# Patient Record
Sex: Female | Born: 1985 | Race: Black or African American | Hispanic: No | Marital: Married | Smoking: Never smoker
Health system: Southern US, Community
[De-identification: ages and names within clinical notes are randomized; demographics above are authoritative.]

---

## 2021-09-08 ENCOUNTER — Other Ambulatory Visit: Payer: Self-pay | Admitting: Internal Medicine

## 2021-09-08 DIAGNOSIS — E0590 Thyrotoxicosis, unspecified without thyrotoxic crisis or storm: Secondary | ICD-10-CM

## 2021-09-09 ENCOUNTER — Ambulatory Visit
Admission: RE | Admit: 2021-09-09 | Discharge: 2021-09-09 | Disposition: A | Discharge status: Home / Self Care | Payer: 59 | Source: Ambulatory Visit | Attending: Internal Medicine | Admitting: Internal Medicine

## 2021-09-09 DIAGNOSIS — E0590 Thyrotoxicosis, unspecified without thyrotoxic crisis or storm: Secondary | ICD-10-CM

## 2021-10-02 ENCOUNTER — Other Ambulatory Visit: Payer: Self-pay | Admitting: Internal Medicine

## 2021-10-02 DIAGNOSIS — R071 Chest pain on breathing: Secondary | ICD-10-CM

## 2021-10-02 NOTE — Progress Notes (Signed)
Allison Butler ?1985-09-27 ? ? ?

## 2021-10-03 ENCOUNTER — Encounter: Payer: Self-pay | Admitting: Radiology

## 2021-10-03 ENCOUNTER — Ambulatory Visit
Admission: RE | Admit: 2021-10-03 | Discharge: 2021-10-03 | Disposition: A | Discharge status: Home / Self Care | Payer: 59 | Source: Ambulatory Visit | Attending: Internal Medicine | Admitting: Internal Medicine

## 2021-11-27 ENCOUNTER — Emergency Department (HOSPITAL_BASED_OUTPATIENT_CLINIC_OR_DEPARTMENT_OTHER)
Admission: EM | Admit: 2021-11-27 | Discharge: 2021-11-27 | Discharge status: Against Medical Advice | Payer: 59 | Attending: Emergency Medicine | Admitting: Emergency Medicine

## 2021-11-27 ENCOUNTER — Encounter (HOSPITAL_BASED_OUTPATIENT_CLINIC_OR_DEPARTMENT_OTHER): Payer: Self-pay

## 2021-11-27 ENCOUNTER — Other Ambulatory Visit: Payer: Self-pay

## 2021-11-27 DIAGNOSIS — Z5321 Procedure and treatment not carried out due to patient leaving prior to being seen by health care provider: Secondary | ICD-10-CM | POA: Diagnosis not present

## 2021-11-27 DIAGNOSIS — R109 Unspecified abdominal pain: Secondary | ICD-10-CM | POA: Insufficient documentation

## 2021-11-27 LAB — COMPREHENSIVE METABOLIC PANEL
ALT: 10 U/L (ref 0–44)
AST: 19 U/L (ref 15–41)
Albumin: 4.5 g/dL (ref 3.5–5.0)
Alkaline Phosphatase: 49 U/L (ref 38–126)
Anion gap: 11 (ref 5–15)
BUN: 11 mg/dL (ref 6–20)
CO2: 27 mmol/L (ref 22–32)
Calcium: 10 mg/dL (ref 8.9–10.3)
Chloride: 102 mmol/L (ref 98–111)
Creatinine, Ser: 0.74 mg/dL (ref 0.44–1.00)
GFR, Estimated: >60 mL/min (ref >60)
Glucose, Bld: 80 mg/dL (ref 70–99)
Potassium: 3.5 mmol/L (ref 3.5–5.1)
Sodium: 140 mmol/L (ref 135–145)
Total Bilirubin: 0.3 mg/dL (ref 0.3–1.2)
Total Protein: 8.1 g/dL (ref 6.5–8.1)

## 2021-11-27 LAB — URINALYSIS, ROUTINE W REFLEX MICROSCOPIC
Bilirubin Urine: NEGATIVE
Glucose, UA: NEGATIVE mg/dL
Hgb urine dipstick: NEGATIVE
Ketones, ur: NEGATIVE mg/dL
Leukocytes,Ua: NEGATIVE
Nitrite: NEGATIVE
Protein, ur: NEGATIVE mg/dL
Specific Gravity, Urine: 1.009 (ref 1.005–1.030)
pH: 6.5 (ref 5.0–8.0)

## 2021-11-27 LAB — CBC
HCT: 35.2 % — ABNORMAL LOW (ref 36.0–46.0)
Hemoglobin: 11 g/dL — ABNORMAL LOW (ref 12.0–15.0)
MCH: 25.2 pg — ABNORMAL LOW (ref 26.0–34.0)
MCHC: 31.3 g/dL (ref 30.0–36.0)
MCV: 80.7 fL (ref 80.0–100.0)
Platelets: 343 10*3/uL (ref 150–400)
RBC: 4.36 MIL/uL (ref 3.87–5.11)
RDW: 14.6 % (ref 11.5–15.5)
WBC: 6 10*3/uL (ref 4.0–10.5)
nRBC: 0 % (ref 0.0–0.2)

## 2021-11-27 LAB — LIPASE, BLOOD: Lipase: 49 U/L (ref 11–51)

## 2021-11-27 LAB — PREGNANCY, URINE: Preg Test, Ur: NEGATIVE

## 2021-11-27 NOTE — ED Notes (Signed)
No answer for room assignment. Noted documented IV in triage. Called two phone numbers in chart with no answer.

## 2021-11-27 NOTE — ED Triage Notes (Signed)
Patient here POV from Home.  Endorses Pain to ABD yesterday with Acute Onset to Umbilical Area yesterday that has continued since it began.  No N/V/D. No Constipation. No Fevers. No Urinary Symptoms.   History of Hernia in 2015. No Recent Trauma or Injury.  NAD Noted during Triage. A&Ox4. GCS 15. Ambulatory.

## 2021-11-27 NOTE — ED Provider Notes (Signed)
Patient never actually placed in room.  Was called from waiting room without answer.     Allison Butler, Jonny Ruiz, MD 11/27/21 2330

## 2021-11-27 NOTE — ED Notes (Signed)
Called patient x2 from waiting room, no response.

## 2021-11-27 NOTE — ED Notes (Signed)
Pt assigned to room 15, pt did not answer for room assignment

## 2021-11-28 ENCOUNTER — Other Ambulatory Visit: Payer: Self-pay | Admitting: Family Medicine

## 2021-11-28 DIAGNOSIS — K429 Umbilical hernia without obstruction or gangrene: Secondary | ICD-10-CM

## 2021-12-01 ENCOUNTER — Inpatient Hospital Stay: Admission: RE | Admit: 2021-12-01 | Payer: 59 | Source: Ambulatory Visit

## 2021-12-08 ENCOUNTER — Ambulatory Visit
Admission: RE | Admit: 2021-12-08 | Discharge: 2021-12-08 | Disposition: A | Discharge status: Home / Self Care | Payer: 59 | Source: Ambulatory Visit | Attending: Family Medicine | Admitting: Family Medicine

## 2021-12-08 DIAGNOSIS — K429 Umbilical hernia without obstruction or gangrene: Secondary | ICD-10-CM

## 2021-12-08 MED ORDER — IOPAMIDOL (ISOVUE-300) INJECTION 61%
100.0000 mL | Freq: Once | INTRAVENOUS | Status: AC | PRN
  Administered 2021-12-08: 100 mL via INTRAVENOUS

## 2022-02-16 ENCOUNTER — Ambulatory Visit: Payer: 59 | Admitting: Family Medicine

## 2022-12-02 ENCOUNTER — Emergency Department (HOSPITAL_BASED_OUTPATIENT_CLINIC_OR_DEPARTMENT_OTHER): Payer: 59

## 2022-12-02 ENCOUNTER — Encounter (HOSPITAL_BASED_OUTPATIENT_CLINIC_OR_DEPARTMENT_OTHER): Payer: Self-pay | Admitting: Radiology

## 2022-12-02 ENCOUNTER — Other Ambulatory Visit: Payer: Self-pay

## 2022-12-02 ENCOUNTER — Emergency Department (HOSPITAL_BASED_OUTPATIENT_CLINIC_OR_DEPARTMENT_OTHER)
Admission: EM | Admit: 2022-12-02 | Discharge: 2022-12-02 | Disposition: A | Discharge status: Home / Self Care | Payer: 59 | Attending: Emergency Medicine | Admitting: Emergency Medicine

## 2022-12-02 ENCOUNTER — Other Ambulatory Visit (HOSPITAL_BASED_OUTPATIENT_CLINIC_OR_DEPARTMENT_OTHER): Payer: Self-pay

## 2022-12-02 DIAGNOSIS — R1032 Left lower quadrant pain: Secondary | ICD-10-CM | POA: Diagnosis present

## 2022-12-02 DIAGNOSIS — N83202 Unspecified ovarian cyst, left side: Secondary | ICD-10-CM | POA: Diagnosis not present

## 2022-12-02 LAB — URINALYSIS, ROUTINE W REFLEX MICROSCOPIC
Bilirubin Urine: NEGATIVE
Glucose, UA: NEGATIVE mg/dL
Hgb urine dipstick: NEGATIVE
Leukocytes,Ua: NEGATIVE
Nitrite: NEGATIVE
Protein, ur: NEGATIVE mg/dL
Specific Gravity, Urine: 1.013 (ref 1.005–1.030)
pH: 5.5 (ref 5.0–8.0)

## 2022-12-02 LAB — COMPREHENSIVE METABOLIC PANEL
ALT: 6 U/L (ref 0–44)
AST: 20 U/L (ref 15–41)
Albumin: 4.1 g/dL (ref 3.5–5.0)
Alkaline Phosphatase: 42 U/L (ref 38–126)
Anion gap: 9 (ref 5–15)
BUN: 11 mg/dL (ref 6–20)
CO2: 20 mmol/L — ABNORMAL LOW (ref 22–32)
Calcium: 9.3 mg/dL (ref 8.9–10.3)
Chloride: 106 mmol/L (ref 98–111)
Creatinine, Ser: 0.66 mg/dL (ref 0.44–1.00)
GFR, Estimated: >60 mL/min (ref >60)
Glucose, Bld: 93 mg/dL (ref 70–99)
Potassium: 4.7 mmol/L (ref 3.5–5.1)
Sodium: 135 mmol/L (ref 135–145)
Total Bilirubin: 0.6 mg/dL (ref 0.3–1.2)
Total Protein: 7.1 g/dL (ref 6.5–8.1)

## 2022-12-02 LAB — CBC WITH DIFFERENTIAL/PLATELET
Abs Immature Granulocytes: 0.01 10*3/uL (ref 0.00–0.07)
Basophils Absolute: 0 10*3/uL (ref 0.0–0.1)
Basophils Relative: 1 %
Eosinophils Absolute: 0.1 10*3/uL (ref 0.0–0.5)
Eosinophils Relative: 1 %
HCT: 35.6 % — ABNORMAL LOW (ref 36.0–46.0)
Hemoglobin: 11 g/dL — ABNORMAL LOW (ref 12.0–15.0)
Immature Granulocytes: 0 %
Lymphocytes Relative: 10 %
Lymphs Abs: 0.6 10*3/uL — ABNORMAL LOW (ref 0.7–4.0)
MCH: 25.5 pg — ABNORMAL LOW (ref 26.0–34.0)
MCHC: 30.9 g/dL (ref 30.0–36.0)
MCV: 82.6 fL (ref 80.0–100.0)
Monocytes Absolute: 0.6 10*3/uL (ref 0.1–1.0)
Monocytes Relative: 9 %
Neutro Abs: 5.1 10*3/uL (ref 1.7–7.7)
Neutrophils Relative %: 79 %
Platelets: 280 10*3/uL (ref 150–400)
RBC: 4.31 MIL/uL (ref 3.87–5.11)
RDW: 14 % (ref 11.5–15.5)
WBC: 6.5 10*3/uL (ref 4.0–10.5)
nRBC: 0 % (ref 0.0–0.2)

## 2022-12-02 LAB — LIPASE, BLOOD: Lipase: 40 U/L (ref 11–51)

## 2022-12-02 LAB — LACTIC ACID, PLASMA: Lactic Acid, Venous: 1.1 mmol/L (ref 0.5–1.9)

## 2022-12-02 LAB — HCG, SERUM, QUALITATIVE: Preg, Serum: NEGATIVE

## 2022-12-02 MED ORDER — ONDANSETRON 4 MG PO TBDP
4.0000 mg | ORAL_TABLET | ORAL | 0 refills | Status: DC | PRN
  Filled 2022-12-02: qty 20, 4d supply, fill #0

## 2022-12-02 MED ORDER — KETOROLAC TROMETHAMINE 30 MG/ML IJ SOLN
30.0000 mg | Freq: Once | INTRAMUSCULAR | Status: AC
  Administered 2022-12-02: 30 mg via INTRAVENOUS
  Filled 2022-12-02: qty 1

## 2022-12-02 MED ORDER — ONDANSETRON HCL 4 MG/2ML IJ SOLN
4.0000 mg | Freq: Once | INTRAMUSCULAR | Status: AC
  Administered 2022-12-02: 4 mg via INTRAVENOUS
  Filled 2022-12-02: qty 2

## 2022-12-02 MED ORDER — SODIUM CHLORIDE 0.9 % IV BOLUS
1000.0000 mL | Freq: Once | INTRAVENOUS | Status: AC
  Administered 2022-12-02: 1000 mL via INTRAVENOUS

## 2022-12-02 MED ORDER — HYDROMORPHONE HCL 1 MG/ML IJ SOLN
1.0000 mg | Freq: Once | INTRAMUSCULAR | Status: AC
  Administered 2022-12-02: 1 mg via INTRAVENOUS
  Filled 2022-12-02: qty 1

## 2022-12-02 MED ORDER — IOHEXOL 300 MG/ML  SOLN
100.0000 mL | Freq: Once | INTRAMUSCULAR | Status: AC | PRN
  Administered 2022-12-02: 80 mL via INTRAVENOUS

## 2022-12-02 MED ORDER — IBUPROFEN 600 MG PO TABS
600.0000 mg | ORAL_TABLET | Freq: Three times a day (TID) | ORAL | 0 refills | Status: AC | PRN
  Filled 2022-12-02: qty 30, 10d supply, fill #0

## 2022-12-02 MED ORDER — TRAMADOL HCL 50 MG PO TABS
50.0000 mg | ORAL_TABLET | Freq: Four times a day (QID) | ORAL | 0 refills | Status: AC | PRN
  Filled 2022-12-02: qty 20, 3d supply, fill #0

## 2022-12-02 NOTE — ED Triage Notes (Signed)
She c/o low abd. (Pelvic area) pain, just to left of midline x 3 days. She denies fever/n/v/d and tells [place] that she had her period "last week and it was real heavy".

## 2022-12-02 NOTE — ED Provider Notes (Signed)
[place] EMERGENCY DEPARTMENT AT [facility] Provider Note   CSN: [id number] Arrival date & time: 12/02/22  4098     History  No chief complaint on file.   Allison Butler is a 37 y.o. female.  HPI Patient reports she started getting some left lower abdominal pain 3 days ago.  Initially she thought it might be fibroid pain.  Pain has severely intensified.  She now has pain with movement or walking.  Patient reports she has had burning and urgency with urination.  No history of frequent urinary tract infection.  Last normal menstrual cycle ended a week ago.  Patient denies abnormal vaginal discharge or bleeding.  She is trying to get pregnant.  No fertility treatments.  Aside from fibroids, patient reports she is healthy.  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  She reports she has had no problem for many years.    Home Medications Prior to Admission medications   Medication Sig Start Date End Date Taking? Authorizing Provider  ibuprofen (ADVIL) 600 MG tablet Take 1 tablet (600 mg total) by mouth every 8 (eight) hours as needed. 12/02/22  Yes Arby Barrette, MD  ondansetron (ZOFRAN-ODT) 4 MG disintegrating tablet Take 1 tablet (4 mg total) by mouth every 4 (four) hours as needed for nausea or vomiting. 12/02/22  Yes Iretta Mangrum, Lebron Conners, MD  traMADol (ULTRAM) 50 MG tablet 1-2 every 6 hours as needed for pain control. 12/02/22  Yes Arby Barrette, MD      Allergies    Aspirin    Review of Systems   Review of Systems  Physical Exam Updated Vital Signs BP 114/61   Pulse 80   Temp 98.4 F (36.9 C) (Oral)   Resp 16   Ht 5\' 6"  (1.676 m)   Wt 66.7 kg   LMP 11/24/2022 (Exact Date)   SpO2 99%   BMI 23.73 kg/m  Physical Exam Constitutional:      Comments: Alert nontoxic very uncomfortable in appearance.  HENT:     Mouth/Throat:     Pharynx: Oropharynx is clear.  Eyes:     Extraocular Movements:  Extraocular movements intact.  Cardiovascular:     Comments: Borderline tachycardia.  Regular. Pulmonary:     Effort: Pulmonary effort is normal.     Breath sounds: Normal breath sounds.  Abdominal:     Comments: Abdomen significantly tender with guarding in left lower quadrant.  Right side is tender with amplified radiation of pain to the left.  Mild left CVA tenderness.  Musculoskeletal:        General: No swelling or tenderness. Normal range of motion.     Right lower leg: No edema.     Left lower leg: No edema.  Skin:    General: Skin is warm and dry.  Neurological:     General: No focal deficit present.     Mental Status: She is oriented to person, place, and time.     Coordination: Coordination normal.  Psychiatric:        Mood and Affect: Mood normal.     ED Results / Procedures / Treatments   Labs (all labs ordered are listed, but only abnormal results are displayed) Labs Reviewed  COMPREHENSIVE METABOLIC PANEL - Abnormal; Notable for the following components:      Result Value   CO2 20 (*)    All other components within normal limits  CBC WITH DIFFERENTIAL/PLATELET - Abnormal; Notable  for the following components:   Hemoglobin 11.0 (*)    HCT 35.6 (*)    MCH 25.5 (*)    Lymphs Abs 0.6 (*)    All other components within normal limits  URINALYSIS, ROUTINE W REFLEX MICROSCOPIC - Abnormal; Notable for the following components:   Color, Urine COLORLESS (*)    Ketones, ur TRACE (*)    All other components within normal limits  LACTIC ACID, PLASMA  LIPASE, BLOOD  HCG, SERUM, QUALITATIVE    EKG None  Radiology US PELVIC COMPLETE W TRANSVAGINAL AND TORSION R/O  Result Date: 12/02/2022 CLINICAL DATA:  Pain left lower quadrant of abdomen EXAM: TRANSABDOMINAL AND TRANSVAGINAL ULTRASOUND OF PELVIS DOPPLER ULTRASOUND OF OVARIES TECHNIQUE: Both transabdominal and transvaginal ultrasound examinations of the pelvis were performed. Transabdominal technique was performed for  global imaging of the pelvis including uterus, ovaries, adnexal regions, and pelvic cul-de-sac. It was necessary to proceed with endovaginal exam following the transabdominal exam to visualize the endometrium and ovaries. Color and duplex Doppler ultrasound was utilized to evaluate blood flow to the ovaries. COMPARISON:  CT done earlier today FINDINGS: Uterus Measurements: 11.4 x 6.8 x 9 cm = volume: 36.18 mL. There is inhomogeneous echogenicity in myometrium. There is 3.7 x 3.9 x 4.2 cm fibroid in the fundus. There is a 2.5 x 2.1 x 3.1 cm fibroid in the left side of body. There is 2.6 x 2.2 cm complex structure in the cervix, possibly fibroid. Endometrium Thickness: 12.1.  No focal abnormality visualized. Right ovary Measurements: 2.5 x 1.6 x 1.7 cm = volume: 3.5 mL. Normal appearance/no adnexal mass. Left ovary Measurements: 4.6 x 3.1 x 2.6 cm = volume: 19.5 mL. There is a 1.9 x 2.2 x 1.9 cm complex cyst in the left adnexa, possibly hemorrhagic cyst/follicle. Pulsed Doppler evaluation of both ovaries demonstrates normal low-resistance arterial and venous waveforms. Other findings Trace amount of free fluid is seen in cul-de-sac. IMPRESSION: There is inhomogeneous echogenicity in myometrium with multiple fibroids. There is a 2.2 cm complex cyst in the left adnexa suggesting recently ruptured cyst/follicle. There is trace amount of free fluid in pelvis, possibly physiological. Electronically Signed   By: Ernie Avena M.D.   On: 12/02/2022 12:47   CT ABDOMEN PELVIS W CONTRAST  Result Date: 12/02/2022 CLINICAL DATA:  Left lower quadrant abdominal pain, stone suspected EXAM: CT ABDOMEN AND PELVIS WITH CONTRAST TECHNIQUE: Multidetector CT imaging of the abdomen and pelvis was performed using the standard protocol following bolus administration of intravenous contrast. RADIATION DOSE REDUCTION: This exam was performed according to the departmental dose-optimization program which includes automated exposure  control, adjustment of the mA and/or kV according to patient size and/or use of iterative reconstruction technique. CONTRAST:  80mL OMNIPAQUE IOHEXOL 300 MG/ML  SOLN COMPARISON:  12/08/2021 FINDINGS: Lower chest: No acute abnormality. Hepatobiliary: No solid liver abnormality is seen. No gallstones, gallbladder wall thickening, or biliary dilatation. Pancreas: Unremarkable. No pancreatic ductal dilatation or surrounding inflammatory changes. Spleen: Normal in size without significant abnormality. Adrenals/Urinary Tract: Adrenal glands are unremarkable. Kidneys are normal, without renal calculi, solid lesion, or hydronephrosis. Bladder is unremarkable. Stomach/Bowel: Stomach is within normal limits. Appendix appears normal. No evidence of bowel wall thickening, distention, or inflammatory changes. Vascular/Lymphatic: No significant vascular findings are present. No enlarged abdominal or pelvic lymph nodes. Reproductive: Bulky, heterogeneous fibroid uterus. Rim enhancing left ovarian corpus luteum or hemorrhagic cyst. Other: Small broad-based midline ventral hernia (series 2, image 45). Trace hyperdense free fluid in the low pelvis. Musculoskeletal:  No acute or significant osseous findings. IMPRESSION: 1. Trace hyperdense free fluid in the low pelvis, consistent with hemoperitoneum, likely related to ruptured functional ovarian cyst or corpus luteum. 2. Bulky, heterogeneous fibroid uterus. 3. Small broad-based midline ventral hernia. Electronically Signed   By: Jearld Lesch M.D.   On: 12/02/2022 10:38    Procedures Procedures    Medications Ordered in ED Medications  sodium chloride 0.9 % bolus 1,000 mL (1,000 mLs Intravenous New Bag/Given 12/02/22 0849)  ketorolac (TORADOL) 30 MG/ML injection 30 mg (30 mg Intravenous Given 12/02/22 0849)  ondansetron (ZOFRAN) injection 4 mg (4 mg Intravenous Given 12/02/22 0849)  iohexol (OMNIPAQUE) 300 MG/ML solution 100 mL (80 mLs Intravenous Contrast Given 12/02/22 1005)   HYDROmorphone (DILAUDID) injection 1 mg (1 mg Intravenous Given 12/02/22 1100)    ED Course/ Medical Decision Making/ A&P                             Medical Decision Making Amount and/or Complexity of Data Reviewed Labs: ordered. Radiology: ordered.  Risk Prescription drug management.   Patient presents as outlined.  She has severe tenderness to palpation left lower quadrant also exacerbated by sitting forward.  She has had associated urinary symptoms.  At this time differential diagnosis includes pyelonephritis\kidney stone\ectopic pregnancy\torsion\diverticulitis.  Patient is tachycardic and reports she was febrile at home.  Will proceed with diagnostic evaluation including lab work and CT imaging.  Will administer Toradol 30 mg IV for pain and Zofran for nausea.  Patient is tachycardic will administer normal saline 1 L.  Urinalysis negative.  Serum pregnancy negative.  Lactic acid 1.1.  Lipase 40.  Metabolic panel normal.  CBC normal except hemoglobin 11.  This is stable from last year.  No change.  Pelvic ultrasound interpreted by radiology left ruptured ovarian cyst with small amount of free fluid.  At this time ovarian torsion\tubal pregnancy\kidney stone\pyelonephritis\diverticulitis ruled out.  Findings are consistent with ruptured ovarian cyst.  Patient is stable and at this time appropriate for outpatient pain control.  Patient is seen by obstetrics.  She is attempting to have a planned pregnancy.  She will follow-up as soon as possible.        Final Clinical Impression(s) / ED Diagnoses Final diagnoses:  Cyst of left ovary    Rx / DC Orders ED Discharge Orders          Ordered    ibuprofen (ADVIL) 600 MG tablet  Every 8 hours PRN        12/02/22 1312    traMADol (ULTRAM) 50 MG tablet        12/02/22 1312    ondansetron (ZOFRAN-ODT) 4 MG disintegrating tablet  Every 4 hours PRN        12/02/22 1312              Arby Barrette, MD 12/02/22 1315

## 2022-12-02 NOTE — Discharge Instructions (Signed)
1.  Follow-up with your obstetrician as soon as possible for recheck.  Return to emergency department immediately if you have new or worsening symptoms. 2.  You may take ibuprofen every 8 hours for pain control.  If you need additional pain control, you may take 1-2 tramadol tablets with the ibuprofen.  You have been prescribed Zofran to take for nausea if needed.

## 2023-05-24 IMAGING — CR DG CHEST 2V
2 series · 2 of 2 positions shown · non-contrast
Comparison: None Available.

CLINICAL DATA: Chest pain.

EXAM:
CHEST - 2 VIEW

[w chest pa]
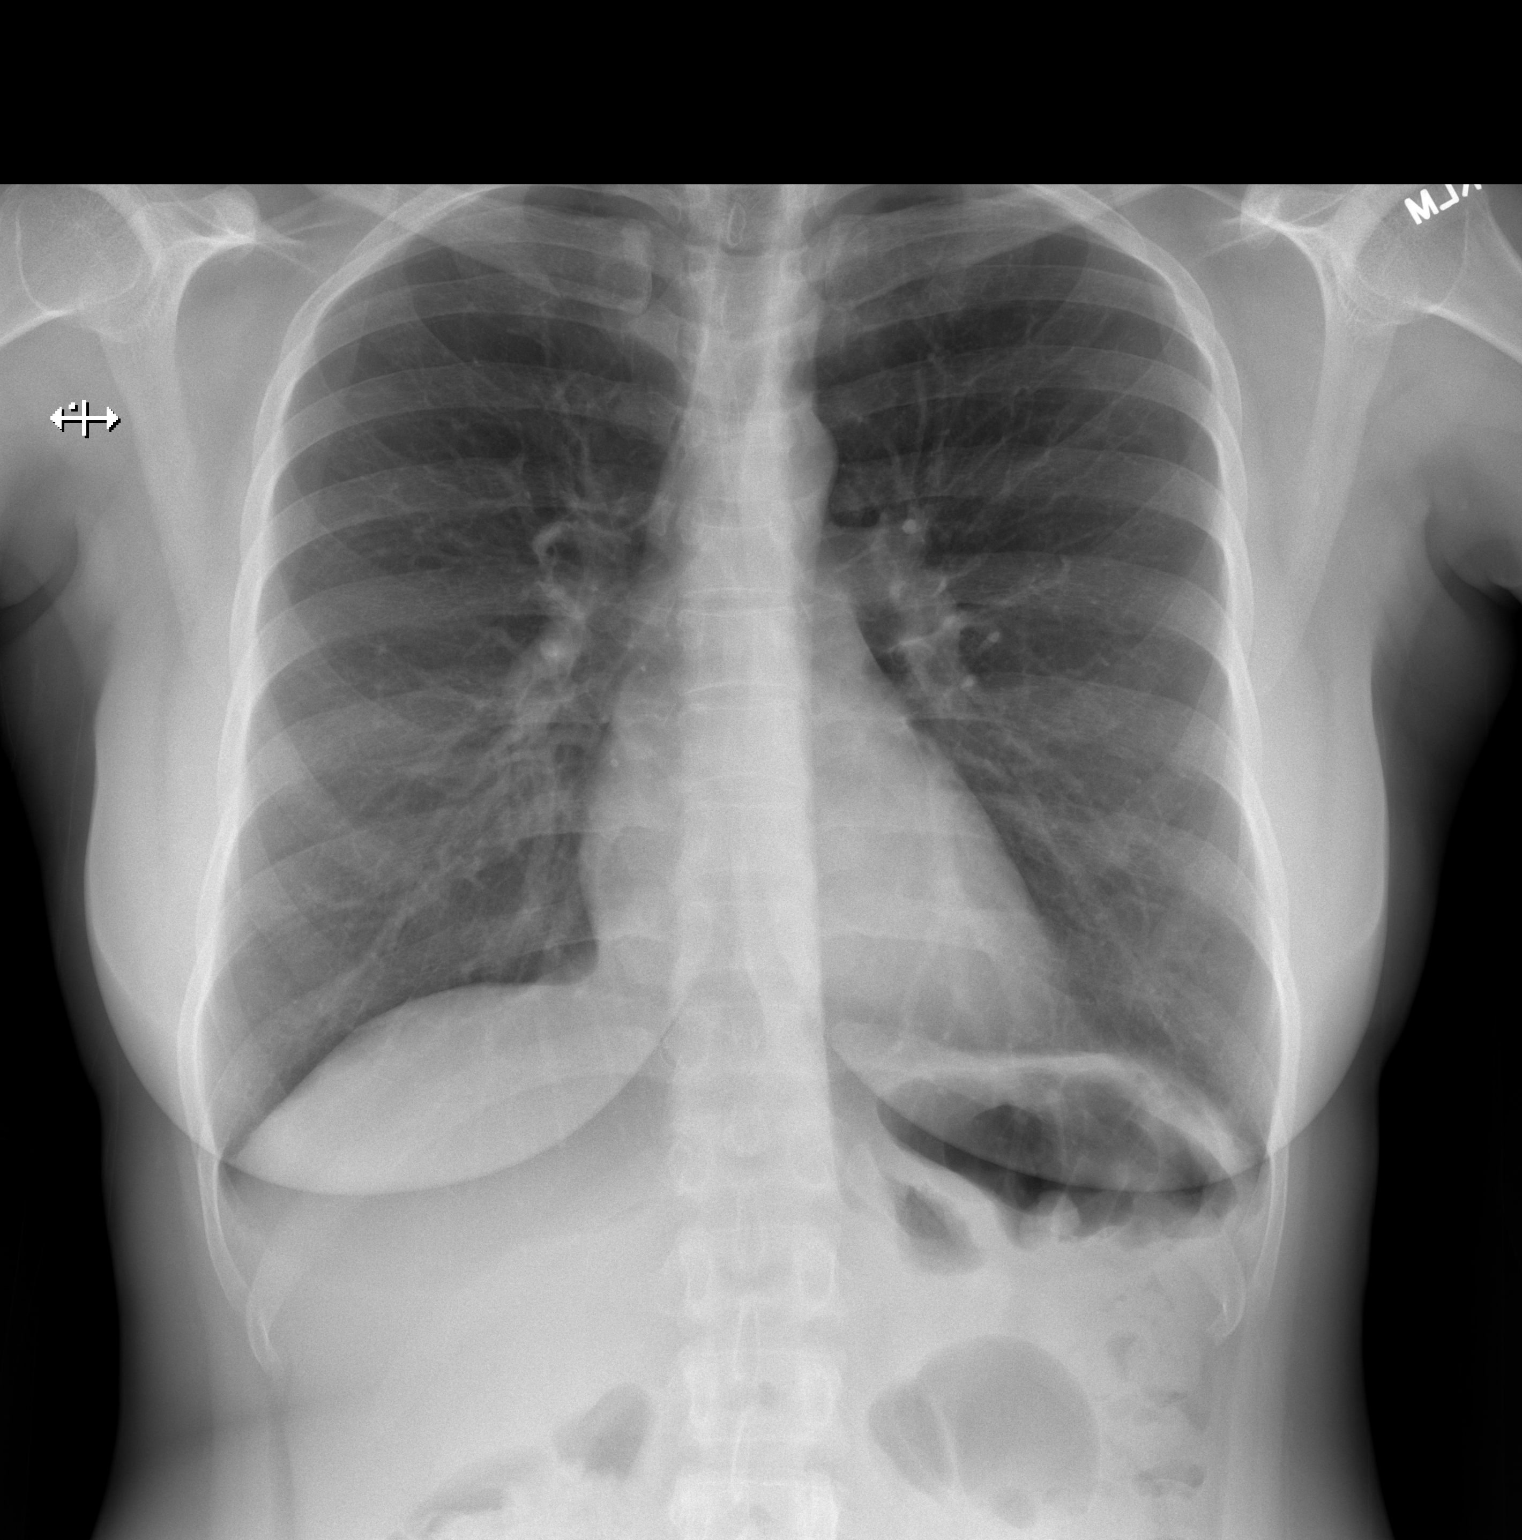

[w chest lat]
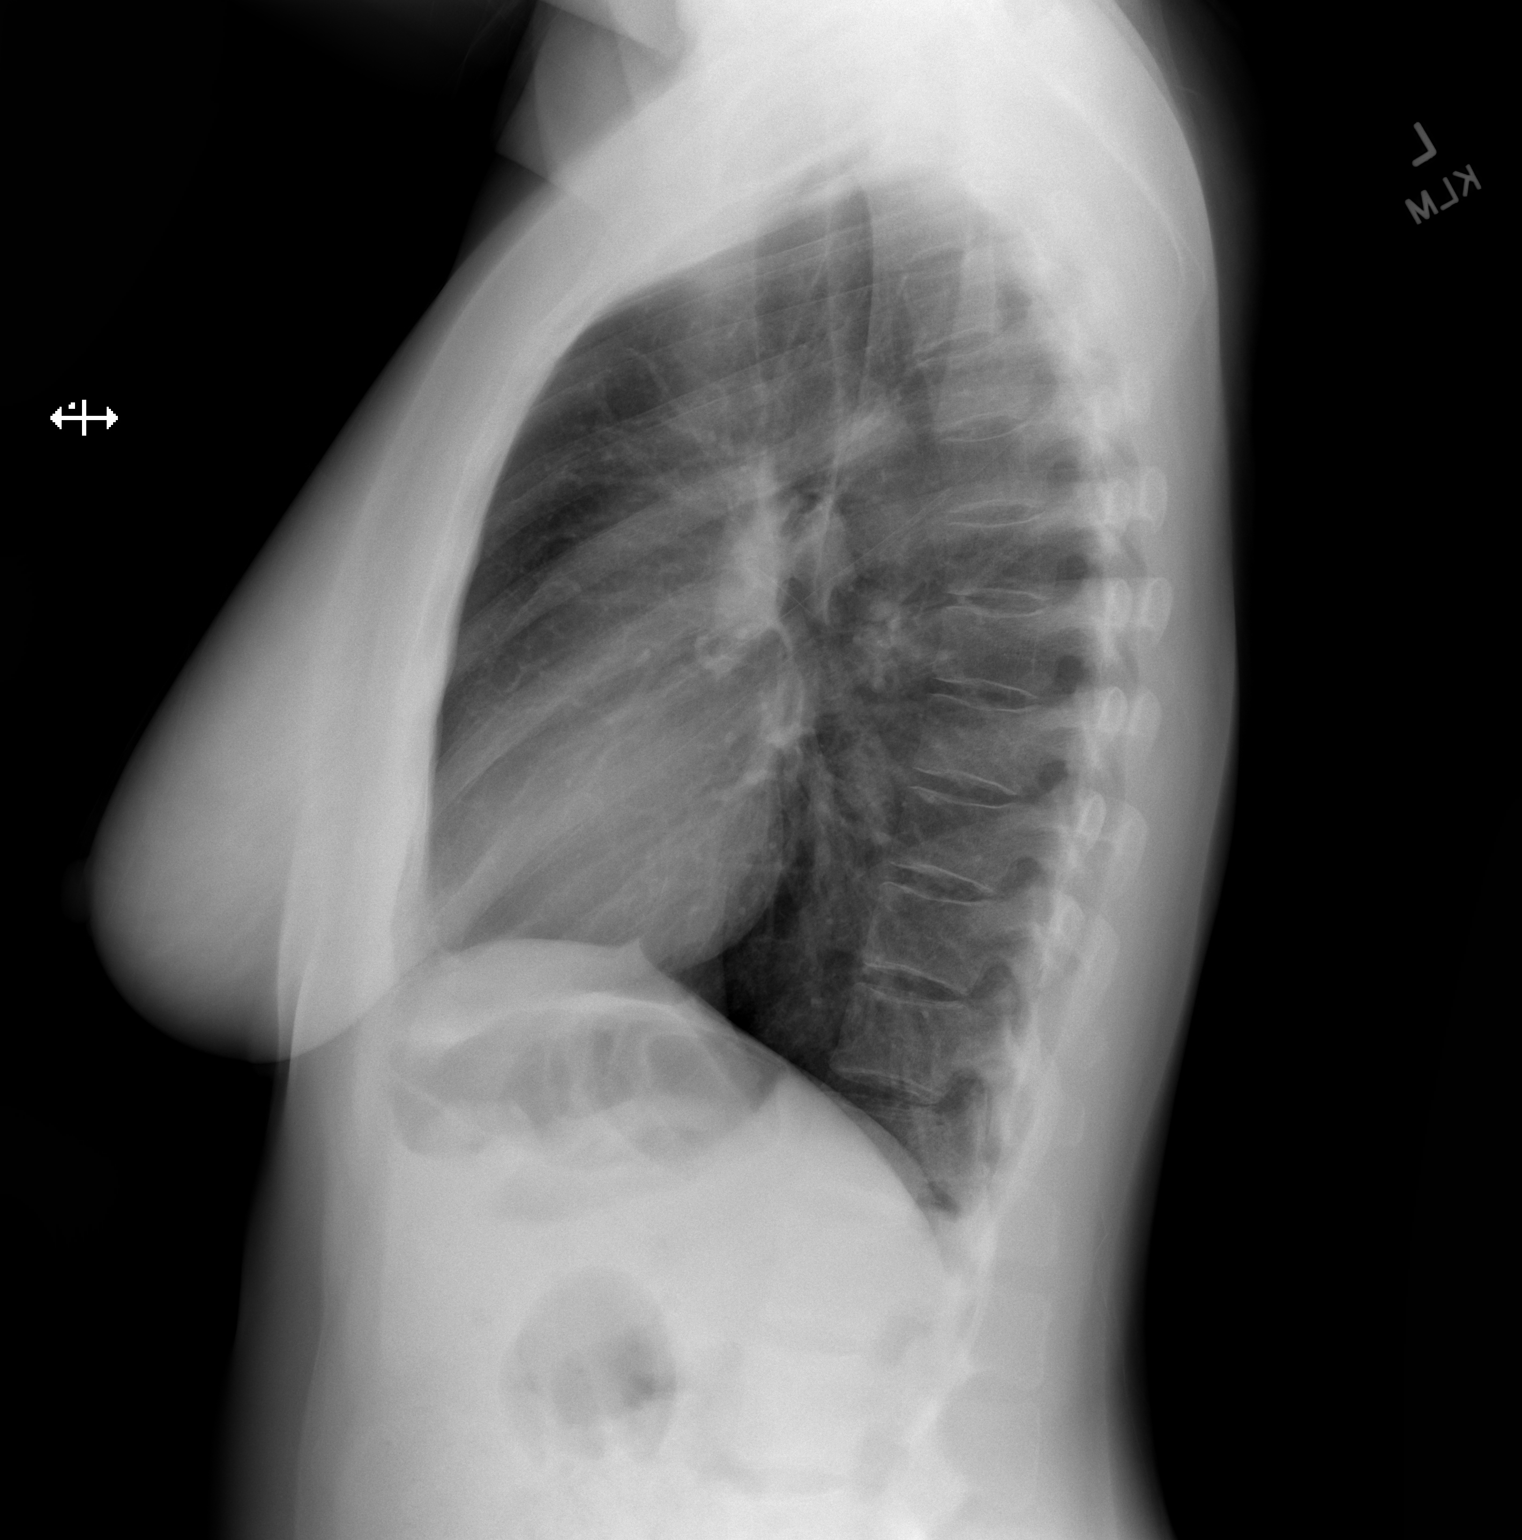

[2 of 2 positions shown; findings below may reference images not displayed]

FINDINGS: The heart size and mediastinal contours are within normal limits.
Both lungs are clear. The visualized skeletal structures are
unremarkable.
IMPRESSION: No active cardiopulmonary disease.

## 2023-10-13 ENCOUNTER — Emergency Department (HOSPITAL_BASED_OUTPATIENT_CLINIC_OR_DEPARTMENT_OTHER)
Admission: EM | Admit: 2023-10-13 | Discharge: 2023-10-13 | Disposition: A | Discharge status: Home / Self Care | Attending: Emergency Medicine | Admitting: Emergency Medicine

## 2023-10-13 ENCOUNTER — Encounter (HOSPITAL_BASED_OUTPATIENT_CLINIC_OR_DEPARTMENT_OTHER): Payer: Self-pay

## 2023-10-13 ENCOUNTER — Other Ambulatory Visit: Payer: Self-pay

## 2023-10-13 ENCOUNTER — Emergency Department (HOSPITAL_BASED_OUTPATIENT_CLINIC_OR_DEPARTMENT_OTHER)

## 2023-10-13 DIAGNOSIS — R110 Nausea: Secondary | ICD-10-CM | POA: Diagnosis not present

## 2023-10-13 DIAGNOSIS — R1030 Lower abdominal pain, unspecified: Secondary | ICD-10-CM | POA: Diagnosis present

## 2023-10-13 LAB — CBC
HCT: 34.1 % — ABNORMAL LOW (ref 36.0–46.0)
Hemoglobin: 11.1 g/dL — ABNORMAL LOW (ref 12.0–15.0)
MCH: 26.1 pg (ref 26.0–34.0)
MCHC: 32.6 g/dL (ref 30.0–36.0)
MCV: 80 fL (ref 80.0–100.0)
Platelets: 312 10*3/uL (ref 150–400)
RBC: 4.26 MIL/uL (ref 3.87–5.11)
RDW: 13.3 % (ref 11.5–15.5)
WBC: 6.6 10*3/uL (ref 4.0–10.5)
nRBC: 0 % (ref 0.0–0.2)

## 2023-10-13 LAB — URINALYSIS, ROUTINE W REFLEX MICROSCOPIC
Bilirubin Urine: NEGATIVE
Glucose, UA: NEGATIVE mg/dL
Hgb urine dipstick: NEGATIVE
Ketones, ur: NEGATIVE mg/dL
Leukocytes,Ua: NEGATIVE
Nitrite: NEGATIVE
Protein, ur: NEGATIVE mg/dL
Specific Gravity, Urine: 1.027 (ref 1.005–1.030)
pH: 6 (ref 5.0–8.0)

## 2023-10-13 LAB — COMPREHENSIVE METABOLIC PANEL WITH GFR
ALT: 6 U/L (ref 0–44)
AST: 14 U/L — ABNORMAL LOW (ref 15–41)
Albumin: 4 g/dL (ref 3.5–5.0)
Alkaline Phosphatase: 66 U/L (ref 38–126)
Anion gap: 12 (ref 5–15)
BUN: 12 mg/dL (ref 6–20)
CO2: 22 mmol/L (ref 22–32)
Calcium: 9.5 mg/dL (ref 8.9–10.3)
Chloride: 105 mmol/L (ref 98–111)
Creatinine, Ser: 0.62 mg/dL (ref 0.44–1.00)
GFR, Estimated: >60 mL/min (ref >60)
Glucose, Bld: 91 mg/dL (ref 70–99)
Potassium: 3.9 mmol/L (ref 3.5–5.1)
Sodium: 139 mmol/L (ref 135–145)
Total Bilirubin: 0.3 mg/dL (ref 0.0–1.2)
Total Protein: 7.3 g/dL (ref 6.5–8.1)

## 2023-10-13 LAB — PREGNANCY, URINE: Preg Test, Ur: NEGATIVE

## 2023-10-13 LAB — LIPASE, BLOOD: Lipase: 36 U/L (ref 11–51)

## 2023-10-13 MED ORDER — SODIUM CHLORIDE 0.9 % IV BOLUS
1000.0000 mL | Freq: Once | INTRAVENOUS | Status: AC
  Administered 2023-10-13: 1000 mL via INTRAVENOUS

## 2023-10-13 MED ORDER — ONDANSETRON HCL 4 MG/2ML IJ SOLN
4.0000 mg | Freq: Once | INTRAMUSCULAR | Status: AC
  Administered 2023-10-13: 4 mg via INTRAVENOUS
  Filled 2023-10-13: qty 2

## 2023-10-13 MED ORDER — IOHEXOL 300 MG/ML  SOLN
100.0000 mL | Freq: Once | INTRAMUSCULAR | Status: AC | PRN
  Administered 2023-10-13: 100 mL via INTRAVENOUS

## 2023-10-13 MED ORDER — MORPHINE SULFATE (PF) 4 MG/ML IV SOLN
4.0000 mg | Freq: Once | INTRAVENOUS | Status: AC
  Administered 2023-10-13: 4 mg via INTRAVENOUS
  Filled 2023-10-13: qty 1

## 2023-10-13 MED ORDER — ONDANSETRON 4 MG PO TBDP: ORAL_TABLET | ORAL | 0 refills | Status: AC

## 2023-10-13 MED ORDER — MORPHINE SULFATE 15 MG PO TABS: 7.5000 mg | ORAL_TABLET | ORAL | 0 refills | Status: AC | PRN

## 2023-10-13 NOTE — ED Triage Notes (Signed)
 L sided abdominal pain starting Saturday, worse overnight. Transvaginal US  done yesterday did not show any change to fibroids. Reports its difficult to urinate and have a BM. Denies N/V/D.

## 2023-10-13 NOTE — Discharge Instructions (Signed)
 Follow up with your GYN.  Return for worsening pain, fevers, inability to eat or drink.  Take 4 over the counter ibuprofen  tablets 3 times a day or 2 over-the-counter naproxen tablets twice a day for pain. Also take tylenol 1000mg (2 extra strength) four times a day.   Then take the pain medicine if you feel like you need it. Narcotics do not help with the pain, they only make you care about it less.  You can become addicted to this, people may break into your house to steal it.  It will constipate you.  If you drive under the influence of this medicine you can get a DUI.

## 2023-10-13 NOTE — ED Provider Notes (Signed)
 [place] EMERGENCY DEPARTMENT AT [facility] Provider Note   CSN: [id number] Arrival date & time: 10/13/23  4098     History  Chief Complaint  Patient presents with   Abdominal Pain    Allison Butler is a 38 y.o. female.  38 yo F with a chief complaints of lower abdominal discomfort.  Started on the left side has been going on for a few days now.  She was seen by her OB/GYN yesterday.  They did a transvaginal ultrasound and showed unchanged fibroids.  She has been nauseated but no vomiting.  Feels like she has pain whenever she tries to urinate or move her bowels.  Denies trauma to the area.  No fevers.          Home Medications Prior to Admission medications   Medication Sig Start Date End Date Taking? Authorizing Provider  morphine (MSIR) 15 MG tablet Take 0.5 tablets (7.5 mg total) by mouth every 4 (four) hours as needed for severe pain (pain score 7-10). 10/13/23  Yes Albertus Hughs, DO  ondansetron  (ZOFRAN -ODT) 4 MG disintegrating tablet 4mg  ODT q4 hours prn nausea/vomit 10/13/23  Yes Mykeal Carrick, DO  ibuprofen  (ADVIL ) 600 MG tablet Take 1 tablet (600 mg total) by mouth every 8 (eight) hours as needed. 12/02/22   Wynetta Heckle, MD  traMADol  (ULTRAM ) 50 MG tablet Take 1-2 tablets (50-100 mg total) by mouth every 6 (six) hours as needed. 12/02/22   Wynetta Heckle, MD      Allergies    Aspirin    Review of Systems   Review of Systems  Physical Exam Updated Vital Signs BP 123/74   Pulse 87   Temp 99.2 F (37.3 C) (Oral)   Resp 16   SpO2 100%  Physical Exam Vitals and nursing note reviewed.  Constitutional:      General: She is not in acute distress.    Appearance: She is well-developed. She is not diaphoretic.  HENT:     Head: Normocephalic and atraumatic.  Eyes:     Pupils: Pupils are equal, round, and reactive to light.  Cardiovascular:     Rate and Rhythm: Regular rhythm. Tachycardia present.     Heart sounds: No murmur heard.    No friction rub.  No gallop.  Pulmonary:     Effort: Pulmonary effort is normal.     Breath sounds: No wheezing or rales.  Abdominal:     General: There is no distension.     Palpations: Abdomen is soft.     Tenderness: There is abdominal tenderness.     Comments: Diffuse lower abdominal pain.   Musculoskeletal:        General: No tenderness.     Cervical back: Normal range of motion and neck supple.  Skin:    General: Skin is warm and dry.  Neurological:     Mental Status: She is alert and oriented to person, place, and time.  Psychiatric:        Behavior: Behavior normal.     ED Results / Procedures / Treatments   Labs (all labs ordered are listed, but only abnormal results are displayed) Labs Reviewed  COMPREHENSIVE METABOLIC PANEL WITH GFR - Abnormal; Notable for the following components:      Result Value   AST 14 (*)    All other components within normal limits  CBC - Abnormal; Notable for the following components:   Hemoglobin 11.1 (*)    HCT 34.1 (*)    All other  components within normal limits  LIPASE, BLOOD  URINALYSIS, ROUTINE W REFLEX MICROSCOPIC  PREGNANCY, URINE    EKG None  Radiology CT ABDOMEN PELVIS W CONTRAST Result Date: 10/13/2023 CLINICAL DATA:  38 year old female with left side abdominal pain onset 4 days ago. History of multiple fibroids. EXAM: CT ABDOMEN AND PELVIS WITH CONTRAST TECHNIQUE: Multidetector CT imaging of the abdomen and pelvis was performed using the standard protocol following bolus administration of intravenous contrast. RADIATION DOSE REDUCTION: This exam was performed according to the departmental dose-optimization program which includes automated exposure control, adjustment of the mA and/or kV according to patient size and/or use of iterative reconstruction technique. CONTRAST:  OMNIPAQUE  IOHEXOL  300 MG/ML  SOLN COMPARISON:  CT Abdomen and Pelvis 12/02/2022. FINDINGS: Lower chest: Normal heart size. No pericardial or pleural effusion. Stable  minimal costophrenic angle atelectasis or scarring from last year. Hepatobiliary: Negative liver and gallbladder. Pancreas: Negative. Spleen: Negative. Adrenals/Urinary Tract: Normal adrenal glands. Stable kidneys, appear nonobstructed. Symmetric renal enhancement. No nephrolithiasis. Diminutive ureters. Mass effect on the urinary bladder from fibroid uterus but otherwise unremarkable. Stomach/Bowel: Redundant large bowel, especially the splenic flexure and the sigmoid colon. Retained large bowel gas and stool similar to the CT last year. No large bowel inflammation identified. Appendix not clearly delineated. There is right lower quadrant free fluid on coronal image 43. This is similar to but increased from the CT last year. Fluid density appears mildly complex. Fluid containing but nondilated right lower quadrant small bowel adjacent to the free fluid. Other small bowel loops appear decompressed and negative. Decompressed stomach. No pneumoperitoneum. No other free fluid identified in the abdomen. Vascular/Lymphatic: Major arterial structures, portal venous system in the abdomen and pelvis appear patent and normal. No lymphadenopathy identified. Reproductive: Enlarged chronic fibroid uterus. Dominant right side fibroid now about 7 cm diameter as seen on series 3, image 71 (versus 5.6 cm last July). Numerous smaller fibroids. Increased distortion of the endometrial canal. No definite adnexal abnormality. Other: Trace if any pelvis free fluid. Musculoskeletal: Stable and negative. IMPRESSION: 1. Progressed chronic Fibroid Uterus since 12/02/2022. Numerous fibroids, with dominant lesion on the right now 7-8 cm diameter (versus 5-6 cm last year). Regional mass effect, including on the urinary bladder. 2. Small volume of nonspecific free fluid in the right lower quadrant, similar to but increased from the CT last year. Appendix is not clearly delineated. But there is no discrete bowel inflammation or obstruction  identified. 3. No other acute or inflammatory process identified in the abdomen or pelvis. Electronically Signed   By: Marlise Simpers M.D.   On: 10/13/2023 07:13    Procedures Procedures    Medications Ordered in ED Medications  morphine (PF) 4 MG/ML injection 4 mg (4 mg Intravenous Given 10/13/23 0540)  ondansetron  (ZOFRAN ) injection 4 mg (4 mg Intravenous Given 10/13/23 0538)  sodium chloride  0.9 % bolus 1,000 mL (0 mLs Intravenous Stopped 10/13/23 0703)  iohexol  (OMNIPAQUE ) 300 MG/ML solution 100 mL (100 mLs Intravenous Contrast Given 10/13/23 4010)    ED Course/ Medical Decision Making/ A&P                                 Medical Decision Making Amount and/or Complexity of Data Reviewed Labs: ordered. Radiology: ordered.  Risk Prescription drug management.   38 yo F with a chief complaints of lower abdominal pain.  This been going on for a few days.  She went to her  OB/GYN clinic yesterday to be evaluated and they did a transvaginal ultrasound that reportedly showed no change to her prior.  I am unable to see these results in the computer.  I asked her about ovarian cysts and she is not sure if they were seen or not.  Will treat pain and nausea.  Bolus of IV fluids.  Blood work.  CT imaging.  Reassess.  No leukocytosis.  No LFT or lipase elevation.  UA negative for infection.   CT with enlarged fibroids.   No other obvious concerning finding on CT.  Discussed results with the patient.  Encouraged her to follow-up with GYN in the office.  She is feeling quite a bit better on repeat assessment.  Think less likely to be torsion.  7:33 AM:  I have discussed the diagnosis/risks/treatment options with the patient.  Evaluation and diagnostic testing in the emergency department does not suggest an emergent condition requiring admission or immediate intervention beyond what has been performed at this time.  They will follow up with PCP. We also discussed returning to the ED immediately if new  or worsening sx occur. We discussed the sx which are most concerning (e.g., sudden worsening pain, fever, inability to tolerate by mouth) that necessitate immediate return. Medications administered to the patient during their visit and any new prescriptions provided to the patient are listed below.  Medications given during this visit Medications  morphine (PF) 4 MG/ML injection 4 mg (4 mg Intravenous Given 10/13/23 0540)  ondansetron  (ZOFRAN ) injection 4 mg (4 mg Intravenous Given 10/13/23 0538)  sodium chloride  0.9 % bolus 1,000 mL (0 mLs Intravenous Stopped 10/13/23 0703)  iohexol  (OMNIPAQUE ) 300 MG/ML solution 100 mL (100 mLs Intravenous Contrast Given 10/13/23 5409)     The patient appears reasonably screen and/or stabilized for discharge and I doubt any other medical condition or other [facility] requiring further screening, evaluation, or treatment in the ED at this time prior to discharge.          Final Clinical Impression(s) / ED Diagnoses Final diagnoses:  Lower abdominal pain    Rx / DC Orders ED Discharge Orders          Ordered    morphine (MSIR) 15 MG tablet  Every 4 hours PRN        10/13/23 0728    ondansetron  (ZOFRAN -ODT) 4 MG disintegrating tablet        10/13/23 0728              Albertus Hughs, DO 10/13/23 [phone]

## 2023-10-13 NOTE — ED Notes (Signed)
 ED Provider at bedside.
